# Patient Record
Sex: Female | Born: 1935 | Race: White | Hispanic: No | Marital: Married | Smoking: Never smoker
Health system: Southern US, Community
[De-identification: ages and names within clinical notes are randomized; demographics above are authoritative.]

## PROBLEM LIST (undated history)

## (undated) DIAGNOSIS — E079 Disorder of thyroid, unspecified: Secondary | ICD-10-CM

## (undated) DIAGNOSIS — I10 Essential (primary) hypertension: Secondary | ICD-10-CM

## (undated) HISTORY — PX: APPENDECTOMY: SHX54

## (undated) HISTORY — DX: Disorder of thyroid, unspecified: E07.9

## (undated) HISTORY — PX: ABDOMINAL HYSTERECTOMY: SHX81

---

## 1989-01-10 HISTORY — PX: BREAST EXCISIONAL BIOPSY: SUR124

## 2018-01-17 HISTORY — PX: BREAST BIOPSY: SHX20

## 2018-12-13 ENCOUNTER — Ambulatory Visit
Payer: Federal, State, Local not specified - PPO | Admitting: Student in an Organized Health Care Education/Training Program

## 2019-04-10 ENCOUNTER — Other Ambulatory Visit: Payer: Self-pay | Admitting: Physician Assistant

## 2019-04-10 DIAGNOSIS — Z1231 Encounter for screening mammogram for malignant neoplasm of breast: Secondary | ICD-10-CM

## 2019-04-19 ENCOUNTER — Other Ambulatory Visit: Payer: Self-pay | Admitting: Physician Assistant

## 2019-04-19 DIAGNOSIS — Z1231 Encounter for screening mammogram for malignant neoplasm of breast: Secondary | ICD-10-CM

## 2019-04-19 DIAGNOSIS — N644 Mastodynia: Secondary | ICD-10-CM

## 2019-05-01 ENCOUNTER — Ambulatory Visit
Admission: RE | Admit: 2019-05-01 | Discharge: 2019-05-01 | Disposition: A | Discharge status: Home / Self Care | Payer: Medicare Other | Source: Ambulatory Visit | Attending: Physician Assistant | Admitting: Physician Assistant

## 2019-05-01 DIAGNOSIS — Z1231 Encounter for screening mammogram for malignant neoplasm of breast: Secondary | ICD-10-CM

## 2019-05-01 DIAGNOSIS — N644 Mastodynia: Secondary | ICD-10-CM

## 2019-08-05 ENCOUNTER — Emergency Department
Admission: EM | Admit: 2019-08-05 | Discharge: 2019-08-05 | Disposition: A | Discharge status: Home / Self Care | Payer: Medicare Other | Attending: Emergency Medicine | Admitting: Emergency Medicine

## 2019-08-05 ENCOUNTER — Ambulatory Visit
Admission: EM | Admit: 2019-08-05 | Discharge: 2019-08-05 | Disposition: A | Discharge status: Home / Self Care | Payer: Medicare Other

## 2019-08-05 ENCOUNTER — Emergency Department: Payer: Medicare Other

## 2019-08-05 ENCOUNTER — Encounter: Payer: Self-pay | Admitting: Emergency Medicine

## 2019-08-05 ENCOUNTER — Other Ambulatory Visit: Payer: Self-pay

## 2019-08-05 DIAGNOSIS — Z79899 Other long term (current) drug therapy: Secondary | ICD-10-CM | POA: Diagnosis not present

## 2019-08-05 DIAGNOSIS — R519 Headache, unspecified: Secondary | ICD-10-CM | POA: Insufficient documentation

## 2019-08-05 DIAGNOSIS — I10 Essential (primary) hypertension: Secondary | ICD-10-CM | POA: Insufficient documentation

## 2019-08-05 DIAGNOSIS — E039 Hypothyroidism, unspecified: Secondary | ICD-10-CM | POA: Insufficient documentation

## 2019-08-05 HISTORY — DX: Essential (primary) hypertension: I10

## 2019-08-05 LAB — URINALYSIS, COMPLETE (UACMP) WITH MICROSCOPIC
Bilirubin Urine: NEGATIVE
Glucose, UA: NEGATIVE mg/dL
Hgb urine dipstick: NEGATIVE
Ketones, ur: NEGATIVE mg/dL
Leukocytes,Ua: NEGATIVE
Nitrite: NEGATIVE
Protein, ur: NEGATIVE mg/dL
Specific Gravity, Urine: 1.004 — ABNORMAL LOW (ref 1.005–1.030)
Squamous Epithelial / HPF: NONE SEEN (ref 0–5)
pH: 7 (ref 5.0–8.0)

## 2019-08-05 LAB — CBC WITH DIFFERENTIAL/PLATELET
Abs Immature Granulocytes: 0.02 10*3/uL (ref 0.00–0.07)
Basophils Absolute: 0 10*3/uL (ref 0.0–0.1)
Basophils Relative: 1 %
Eosinophils Absolute: 0.1 10*3/uL (ref 0.0–0.5)
Eosinophils Relative: 1 %
HCT: 38.6 % (ref 36.0–46.0)
Hemoglobin: 12.6 g/dL (ref 12.0–15.0)
Immature Granulocytes: 0 %
Lymphocytes Relative: 15 %
Lymphs Abs: 1 10*3/uL (ref 0.7–4.0)
MCH: 31.2 pg (ref 26.0–34.0)
MCHC: 32.6 g/dL (ref 30.0–36.0)
MCV: 95.5 fL (ref 80.0–100.0)
Monocytes Absolute: 0.4 10*3/uL (ref 0.1–1.0)
Monocytes Relative: 7 %
Neutro Abs: 4.9 10*3/uL (ref 1.7–7.7)
Neutrophils Relative %: 76 %
Platelets: 171 10*3/uL (ref 150–400)
RBC: 4.04 MIL/uL (ref 3.87–5.11)
RDW: 13.7 % (ref 11.5–15.5)
WBC: 6.4 10*3/uL (ref 4.0–10.5)
nRBC: 0 % (ref 0.0–0.2)

## 2019-08-05 LAB — BASIC METABOLIC PANEL
Anion gap: 11 (ref 5–15)
BUN: 14 mg/dL (ref 8–23)
CO2: 28 mmol/L (ref 22–32)
Calcium: 9.7 mg/dL (ref 8.9–10.3)
Chloride: 102 mmol/L (ref 98–111)
Creatinine, Ser: 0.96 mg/dL (ref 0.44–1.00)
GFR calc Af Amer: >60 mL/min (ref >60)
GFR calc non Af Amer: 55 mL/min — ABNORMAL LOW (ref >60)
Glucose, Bld: 103 mg/dL — ABNORMAL HIGH (ref 70–99)
Potassium: 4.3 mmol/L (ref 3.5–5.1)
Sodium: 141 mmol/L (ref 135–145)

## 2019-08-05 NOTE — ED Triage Notes (Signed)
Presents with a 3 day hx of HTN and h/a  Also has had some dizziness  Was sent in from [place]

## 2019-08-05 NOTE — ED Triage Notes (Addendum)
Patient reports that her BP has been elevated over the weekend. States she just moved to the area and does not have a PCP here yet.  Denies headaches, lightheadedness, dizziness, chest pain, ShOB

## 2019-08-05 NOTE — ED Provider Notes (Signed)
[facility] Emergency Department Provider Note ____________________________________________   First MD Initiated Contact with Patient 08/05/19 1438     (approximate)  I have reviewed the triage vital signs and the nursing notes.  HISTORY  Chief Complaint Hypertension   HPI Ashlee Prince is a 85 y.o. female presents to the ED for evaluation of headache and hypertension.   History of HTN on 2 agents: Losartan and Bystolic.  Hypothyroidism on Synthroid.   Patient reports compliance with her blood pressure medications and no changes to her regimen.  She reports having high blood pressure "all weekend," referring to elevated BP readings at home on her home cuff over the past 3 days.  She reports checking her blood pressure daily at baseline, noting systolics 100-150 last week, but this weekend have frequently been "getting close to 200."  She reports some associated dizziness with standing that self resolves in a matter of seconds, but denies additional complaints.  She denies syncope, falls, trauma, chest pain, shortness of breath, abdominal pain or vision changes.  She reports since being here in the ED developing a mild headache, that she relates to wait times in a long day.  She reports going to urgent care prior to this, and urgent care immediately sending her here for evaluation.  Denies headache prior to this afternoon.  Past Medical History:  Diagnosis Date  . Hypertension     There are no problems to display for this patient.   Past Surgical History:  Procedure Laterality Date  . ABDOMINAL HYSTERECTOMY    . APPENDECTOMY    . BREAST BIOPSY Right 01/17/2018   benign x 2  . BREAST EXCISIONAL BIOPSY Left 1991   2 areas benign    Prior to Admission medications   Medication Sig Start Date End Date Taking? Authorizing Provider  levothyroxine (SYNTHROID) 88 MCG tablet Take 88 mcg by mouth daily before breakfast.   Yes [provider]   lisinopril-hydrochlorothiazide (ZESTORETIC) 10-12.5 MG tablet Take 1 tablet by mouth daily.   Yes [provider]  losartan (COZAAR) 50 MG tablet Take 50 mg by mouth daily.   Yes [provider]  nebivolol (BYSTOLIC) 10 MG tablet Take 10 mg by mouth daily.   Yes [provider]  pravastatin (PRAVACHOL) 10 MG tablet Take 10 mg by mouth daily.   Yes [provider]    Allergies Other, Propoxyphene, and Sulfa antibiotics  No family history on file.  Social History Social History   Tobacco Use  . Smoking status: Never Smoker  . Smokeless tobacco: Never Used  Vaping Use  . Vaping Use: Never used  Substance Use Topics  . Alcohol use: Not Currently  . Drug use: Not on file    Review of Systems  Constitutional: No fever/chills Eyes: No visual changes. ENT: No sore throat. Cardiovascular: Denies chest pain.  Positive for elevated blood pressure. Respiratory: Denies shortness of breath. Gastrointestinal: No abdominal pain.  No nausea, no vomiting.  No diarrhea.  No constipation. Genitourinary: Negative for dysuria. Musculoskeletal: Negative for back pain. Skin: Negative for rash. Neurological: Negative for focal weakness or numbness.  Positive for headache.   ____________________________________________   PHYSICAL EXAM:  VITAL SIGNS: ED Triage Vitals  Enc Vitals Group     BP 08/05/19 1430 (!) 205/124     Pulse Rate 08/05/19 1431 70     Resp 08/05/19 1430 18     Temp 08/05/19 1430 98 F (36.7 C)     Temp Source  08/05/19 1430 Oral     SpO2 08/05/19 1430 98 %     Weight 08/05/19 1431 125 lb (56.7 kg)     Height 08/05/19 1431 5\' 2"  (1.575 m)     Head Circumference --      Peak Flow --      Pain Score 08/05/19 1431 8     Pain Loc --      Pain Edu? --      Excl. in GC? --      Constitutional: Alert and oriented. Well appearing and in no acute distress.  Conversational in full sentences.  Able to get up from the stretcher  independently and ambulate with a normal gait without distress.  She reports a similar lightheaded dizziness with standing, self resolved so she is ambulating. Eyes: Conjunctivae are normal. PERRL. EOMI. Head: Atraumatic. Nose: No congestion/rhinnorhea. Mouth/Throat: Mucous membranes are moist.  Oropharynx non-erythematous. Neck: No stridor. No cervical spine tenderness to palpation. Cardiovascular: Normal rate, regular rhythm. Grossly normal heart sounds.  Good peripheral circulation. Respiratory: Normal respiratory effort.  No retractions. Lungs CTAB. Gastrointestinal: Soft , nondistended, nontender to palpation. No abdominal bruits. No CVA tenderness. Musculoskeletal: No lower extremity tenderness nor edema.  No joint effusions. No signs of acute trauma. Neurologic:  Normal speech and language. No gross focal neurologic deficits are appreciated. No gait instability noted. Cranial nerves II through XII intact. Full strength and sensation in all 4 extremities Skin:  Skin is warm, dry and intact. No rash noted. Psychiatric: Mood and affect are normal. Speech and behavior are normal.  ____________________________________________   LABS (all labs ordered are listed, but only abnormal results are displayed)  Labs Reviewed  BASIC METABOLIC PANEL - Abnormal; Notable for the following components:      Result Value   Glucose, Bld 103 (*)    GFR calc non Af Amer 55 (*)    All other components within normal limits  URINALYSIS, COMPLETE (UACMP) WITH MICROSCOPIC - Abnormal; Notable for the following components:   Color, Urine COLORLESS (*)    APPearance CLEAR (*)    Specific Gravity, Urine 1.004 (*)    Bacteria, UA RARE (*)    All other components within normal limits  CBC WITH DIFFERENTIAL/PLATELET   ____________________________________________  12 Lead EKG Sinus rhythm, rate of 75 bpm and left axis deviation.  Normal intervals.  No evidence of acute  ischemia.  ____________________________________________  RADIOLOGY  ED MD interpretation: CT head obtained possibility of SAH in the setting of hypertension and mild headache, results reviewed remarkable for no evidence of acute intracranial pathology  Official radiology report(s): CT Head Wo Contrast  Result Date: 08/05/2019 CLINICAL DATA:  Headache, intracranial hemorrhage suspected Dizziness, non-specific acute headache and hypertension, eval SAH EXAM: CT HEAD WITHOUT CONTRAST TECHNIQUE: Contiguous axial images were obtained from the base of the skull through the vertex without intravenous contrast. COMPARISON:  None. FINDINGS: Brain: Age related atrophy. No intracranial hemorrhage, mass effect, or midline shift. No hydrocephalus. The basilar cisterns are patent. Mild periventricular white matter changes typical of chronic small vessel ischemia. No evidence of territorial infarct or acute ischemia. No extra-axial or intracranial fluid collection. Vascular: Atherosclerosis of skullbase vasculature without hyperdense vessel or abnormal calcification. Skull: No fracture or focal lesion. Sinuses/Orbits: Paranasal sinuses and mastoid air cells are clear. The visualized orbits are unremarkable. Bilateral cataract resection Other: None. IMPRESSION: 1. No acute intracranial abnormality. Specifically, no subarachnoid hemorrhage. 2. Age related atrophy and chronic small vessel ischemia. Electronically Signed  By: Narda Rutherford M.D.   On: 08/05/2019 15:49    ____________________________________________   PROCEDURES  Procedure(s) performed (including Critical Care):  Procedures   ____________________________________________   INITIAL IMPRESSION / ASSESSMENT AND PLAN / ED COURSE  Pleasant 85 year old woman presenting with asymptomatic hypertension, without evidence of endorgan damage or additional acute pathology, and amenable to discharge home with PCP follow-up.  Patient persistently  hypertensive with systolics ranging 180-200s.  She reports she has felt well over the past 3 days as she has noticed these high systolics at home, and reports generating a mild headache since waiting in the waiting room.  CT head without evidence of SAH or any acute pathology.  Blood work without acute derangements and nonischemic EKG.  She has no chest pain, shortness of breath, syncope or further concerning symptoms.  Home dose of losartan provided here in the ED with improvement of her blood pressure.  She continues to have no complaints.  Advised patient of maintaining home blood pressure log/diary to bring to her PCP within the next 5 days to discuss her blood pressure regimen.  Return precautions for the ED were discussed.  Clinical Course as of Aug 05 2046  Mon Aug 05, 2019  1613 Patient reports taking her home 50 mg losartan here in the ED at about 3:30 PM.   [DS]  1704 Reassessed.  Patient reports feeling well having no complaints.  She requests discharge home.  Educated patient on home BP management and surveillance of her blood pressure once daily in normal circumstance, and writing this value down to bring to her PCP.  We discussed her reassuring work-up here in the ED without evidence of endorgan damage or acute pathology.  Return precautions for the ED were discussed.   [DS]    Clinical Course User Index [DS] Delton Prairie, MD     ____________________________________________   FINAL CLINICAL IMPRESSION(S) / ED DIAGNOSES  Final diagnoses:  Essential hypertension  Acute nonintractable headache, unspecified headache type     ED Discharge Orders    None       Kerman Pfost   Note:  This document was prepared using Dragon voice recognition software and may include unintentional dictation errors.   Delton Prairie, MD 08/05/19 [phone]

## 2019-08-05 NOTE — ED Notes (Signed)
Pt took a 50mg  Losartan from home use at this time.

## 2019-08-05 NOTE — ED Notes (Signed)
Patient is being discharged from the Urgent Care and sent to the Emergency Department via POV . Per Wendee Beavers, NP, patient is in need of higher level of care due to HTN requiring further workup than appropriate for Urgent Care. Patient is aware and verbalizes understanding of plan of care.  Vitals:   08/05/19 1204 08/05/19 1210  BP: (!) 191/110 (!) 190/112  Pulse: 72   Resp: 14   Temp: 98.1 F (36.7 C)   SpO2: 95%

## 2019-08-05 NOTE — Discharge Instructions (Signed)
You were seen in the emergency department because of your elevated blood pressure and headache.  We did blood work, EKG heart testing and a CT scan of your head that did not show any signs of damage from your high blood pressure.  Please continue your blood pressure medication regimen at home.  I recommend checking your blood pressure about once daily at home and a normal environment when you are not stressed, and write this number down to bring to your PCP.  If you develop any worsening acute headaches, chest pain, shortness of breath or passing out, please return to the ED.

## 2019-08-05 NOTE — ED Notes (Signed)
Pt ambulatory to the restroom without assistance.  

## 2019-08-05 NOTE — Discharge Instructions (Signed)
Please proceed straight to the emergency department at [facility].

## 2020-04-17 ENCOUNTER — Other Ambulatory Visit: Payer: Self-pay | Admitting: Physician Assistant

## 2020-04-17 DIAGNOSIS — Z1231 Encounter for screening mammogram for malignant neoplasm of breast: Secondary | ICD-10-CM

## 2020-04-22 ENCOUNTER — Other Ambulatory Visit: Payer: Self-pay | Admitting: Physician Assistant

## 2020-04-22 DIAGNOSIS — I340 Nonrheumatic mitral (valve) insufficiency: Secondary | ICD-10-CM

## 2020-04-23 ENCOUNTER — Other Ambulatory Visit: Payer: Self-pay | Admitting: Physician Assistant

## 2020-04-23 DIAGNOSIS — I340 Nonrheumatic mitral (valve) insufficiency: Secondary | ICD-10-CM

## 2020-04-30 ENCOUNTER — Ambulatory Visit (INDEPENDENT_AMBULATORY_CARE_PROVIDER_SITE_OTHER): Payer: Medicare Other

## 2020-04-30 ENCOUNTER — Other Ambulatory Visit: Payer: Self-pay

## 2020-04-30 DIAGNOSIS — I340 Nonrheumatic mitral (valve) insufficiency: Secondary | ICD-10-CM

## 2020-04-30 LAB — ECHOCARDIOGRAM COMPLETE
AR max vel: 1.99 cm2
AV Area VTI: 1.93 cm2
AV Area mean vel: 2.08 cm2
AV Mean grad: 3 mmHg
AV Peak grad: 5.5 mmHg
Ao pk vel: 1.17 m/s
Area-P 1/2: 2.82 cm2
Calc EF: 54.8 %
S' Lateral: 2.4 cm
Single Plane A2C EF: 51.4 %
Single Plane A4C EF: 56.8 %

## 2020-05-05 ENCOUNTER — Ambulatory Visit
Admission: RE | Admit: 2020-05-05 | Discharge: 2020-05-05 | Disposition: A | Discharge status: Home / Self Care | Payer: Medicare Other | Source: Ambulatory Visit | Attending: Physician Assistant | Admitting: Physician Assistant

## 2020-05-05 ENCOUNTER — Other Ambulatory Visit: Payer: Self-pay

## 2020-05-05 DIAGNOSIS — Z1231 Encounter for screening mammogram for malignant neoplasm of breast: Secondary | ICD-10-CM | POA: Diagnosis not present

## 2020-07-23 ENCOUNTER — Other Ambulatory Visit (HOSPITAL_COMMUNITY): Payer: Self-pay | Admitting: Orthopedic Surgery

## 2020-07-23 ENCOUNTER — Other Ambulatory Visit: Payer: Self-pay | Admitting: Orthopedic Surgery

## 2020-07-23 DIAGNOSIS — M9933 Osseous stenosis of neural canal of lumbar region: Secondary | ICD-10-CM

## 2020-07-30 ENCOUNTER — Ambulatory Visit
Admission: RE | Admit: 2020-07-30 | Discharge: 2020-07-30 | Disposition: A | Discharge status: Home / Self Care | Payer: Medicare Other | Source: Ambulatory Visit | Attending: Orthopedic Surgery | Admitting: Orthopedic Surgery

## 2020-07-30 ENCOUNTER — Other Ambulatory Visit: Payer: Self-pay

## 2020-07-30 DIAGNOSIS — M9933 Osseous stenosis of neural canal of lumbar region: Secondary | ICD-10-CM | POA: Diagnosis present

## 2020-10-12 ENCOUNTER — Other Ambulatory Visit (HOSPITAL_COMMUNITY): Payer: Self-pay | Admitting: Gastroenterology

## 2020-10-12 ENCOUNTER — Other Ambulatory Visit: Payer: Self-pay | Admitting: Gastroenterology

## 2020-10-12 DIAGNOSIS — R1011 Right upper quadrant pain: Secondary | ICD-10-CM

## 2020-10-12 DIAGNOSIS — R9389 Abnormal findings on diagnostic imaging of other specified body structures: Secondary | ICD-10-CM

## 2020-10-22 ENCOUNTER — Ambulatory Visit
Admission: RE | Admit: 2020-10-22 | Discharge: 2020-10-22 | Disposition: A | Discharge status: Home / Self Care | Payer: Medicare Other | Source: Ambulatory Visit | Attending: Gastroenterology | Admitting: Gastroenterology

## 2020-10-22 ENCOUNTER — Other Ambulatory Visit: Payer: Self-pay

## 2020-10-22 DIAGNOSIS — R9389 Abnormal findings on diagnostic imaging of other specified body structures: Secondary | ICD-10-CM | POA: Diagnosis not present

## 2020-10-22 DIAGNOSIS — R1011 Right upper quadrant pain: Secondary | ICD-10-CM | POA: Diagnosis present

## 2020-10-22 MED ORDER — GADOBUTROL 1 MMOL/ML IV SOLN
5.0000 mL | Freq: Once | INTRAVENOUS | Status: AC | PRN
  Administered 2020-10-22: 5 mL via INTRAVENOUS

## 2021-04-01 ENCOUNTER — Other Ambulatory Visit: Payer: Self-pay | Admitting: Physician Assistant

## 2021-04-02 ENCOUNTER — Other Ambulatory Visit: Payer: Self-pay | Admitting: Infectious Diseases

## 2021-04-02 DIAGNOSIS — Z1231 Encounter for screening mammogram for malignant neoplasm of breast: Secondary | ICD-10-CM

## 2021-05-10 ENCOUNTER — Ambulatory Visit
Admission: RE | Admit: 2021-05-10 | Discharge: 2021-05-10 | Disposition: A | Discharge status: Home / Self Care | Payer: Medicare Other | Source: Ambulatory Visit | Attending: Infectious Diseases | Admitting: Infectious Diseases

## 2021-05-10 DIAGNOSIS — Z1231 Encounter for screening mammogram for malignant neoplasm of breast: Secondary | ICD-10-CM | POA: Insufficient documentation

## 2021-05-11 ENCOUNTER — Other Ambulatory Visit: Payer: Self-pay | Admitting: Infectious Diseases

## 2021-05-11 DIAGNOSIS — R270 Ataxia, unspecified: Secondary | ICD-10-CM

## 2021-05-11 DIAGNOSIS — I10 Essential (primary) hypertension: Secondary | ICD-10-CM

## 2021-05-14 ENCOUNTER — Ambulatory Visit
Admission: RE | Admit: 2021-05-14 | Discharge: 2021-05-14 | Disposition: A | Discharge status: Home / Self Care | Payer: Medicare Other | Source: Ambulatory Visit | Attending: Infectious Diseases | Admitting: Infectious Diseases

## 2021-05-14 DIAGNOSIS — I10 Essential (primary) hypertension: Secondary | ICD-10-CM

## 2021-05-14 DIAGNOSIS — R270 Ataxia, unspecified: Secondary | ICD-10-CM

## 2022-04-06 ENCOUNTER — Other Ambulatory Visit: Payer: Self-pay | Admitting: Infectious Diseases

## 2022-04-06 DIAGNOSIS — Z1231 Encounter for screening mammogram for malignant neoplasm of breast: Secondary | ICD-10-CM

## 2022-04-21 ENCOUNTER — Other Ambulatory Visit: Payer: Self-pay | Admitting: Infectious Diseases

## 2022-04-21 DIAGNOSIS — G44319 Acute post-traumatic headache, not intractable: Secondary | ICD-10-CM

## 2022-04-21 DIAGNOSIS — W1800XA Striking against unspecified object with subsequent fall, initial encounter: Secondary | ICD-10-CM

## 2022-04-25 ENCOUNTER — Ambulatory Visit
Admission: RE | Admit: 2022-04-25 | Discharge: 2022-04-25 | Disposition: A | Discharge status: Home / Self Care | Payer: Medicare Other | Source: Ambulatory Visit | Attending: Infectious Diseases | Admitting: Infectious Diseases

## 2022-04-25 DIAGNOSIS — G44319 Acute post-traumatic headache, not intractable: Secondary | ICD-10-CM | POA: Diagnosis present

## 2022-04-25 DIAGNOSIS — W1800XA Striking against unspecified object with subsequent fall, initial encounter: Secondary | ICD-10-CM | POA: Insufficient documentation

## 2022-04-25 DIAGNOSIS — X58XXXA Exposure to other specified factors, initial encounter: Secondary | ICD-10-CM | POA: Insufficient documentation

## 2022-06-21 ENCOUNTER — Ambulatory Visit
Admission: RE | Admit: 2022-06-21 | Discharge: 2022-06-21 | Disposition: A | Discharge status: Home / Self Care | Payer: Medicare Other | Source: Ambulatory Visit | Attending: Infectious Diseases | Admitting: Infectious Diseases

## 2022-06-21 DIAGNOSIS — Z1231 Encounter for screening mammogram for malignant neoplasm of breast: Secondary | ICD-10-CM | POA: Diagnosis not present

## 2022-06-24 ENCOUNTER — Other Ambulatory Visit: Payer: Self-pay | Admitting: Infectious Diseases

## 2022-06-24 DIAGNOSIS — R928 Other abnormal and inconclusive findings on diagnostic imaging of breast: Secondary | ICD-10-CM

## 2022-06-24 DIAGNOSIS — N6489 Other specified disorders of breast: Secondary | ICD-10-CM

## 2022-06-28 ENCOUNTER — Ambulatory Visit
Admission: RE | Admit: 2022-06-28 | Discharge: 2022-06-28 | Disposition: A | Discharge status: Home / Self Care | Payer: Medicare Other | Source: Ambulatory Visit | Attending: Infectious Diseases | Admitting: Infectious Diseases

## 2022-06-28 DIAGNOSIS — R928 Other abnormal and inconclusive findings on diagnostic imaging of breast: Secondary | ICD-10-CM | POA: Diagnosis present

## 2022-06-28 DIAGNOSIS — N6489 Other specified disorders of breast: Secondary | ICD-10-CM

## 2022-07-04 ENCOUNTER — Other Ambulatory Visit (INDEPENDENT_AMBULATORY_CARE_PROVIDER_SITE_OTHER): Payer: Self-pay | Admitting: Nurse Practitioner

## 2022-07-04 DIAGNOSIS — I83893 Varicose veins of bilateral lower extremities with other complications: Secondary | ICD-10-CM

## 2022-07-07 ENCOUNTER — Ambulatory Visit (INDEPENDENT_AMBULATORY_CARE_PROVIDER_SITE_OTHER): Payer: Medicare Other

## 2022-07-07 ENCOUNTER — Encounter (INDEPENDENT_AMBULATORY_CARE_PROVIDER_SITE_OTHER): Payer: Self-pay | Admitting: Nurse Practitioner

## 2022-07-07 ENCOUNTER — Ambulatory Visit (INDEPENDENT_AMBULATORY_CARE_PROVIDER_SITE_OTHER): Payer: Medicare Other | Admitting: Nurse Practitioner

## 2022-07-07 VITALS — BP 190/121 | HR 79 | Resp 18 | Ht 62.0 in | Wt 121.0 lb

## 2022-07-07 DIAGNOSIS — I8312 Varicose veins of left lower extremity with inflammation: Secondary | ICD-10-CM

## 2022-07-07 DIAGNOSIS — I10 Essential (primary) hypertension: Secondary | ICD-10-CM | POA: Diagnosis not present

## 2022-07-07 DIAGNOSIS — I8311 Varicose veins of right lower extremity with inflammation: Secondary | ICD-10-CM | POA: Diagnosis not present

## 2022-07-07 DIAGNOSIS — I83893 Varicose veins of bilateral lower extremities with other complications: Secondary | ICD-10-CM

## 2022-07-08 ENCOUNTER — Encounter (INDEPENDENT_AMBULATORY_CARE_PROVIDER_SITE_OTHER): Payer: Self-pay | Admitting: Nurse Practitioner

## 2022-07-08 NOTE — Progress Notes (Signed)
Subjective:    Patient ID: Ashlee Prince, female    DOB: [birth year], 88 y.o.   MRN: [id number] Chief Complaint  Patient presents with   Establish Care    Ashlee Prince is an 88 year old female that presents today as a referral from her primary care provider Dr. Sampson Goon for evaluation of possible varicose veins causing pain in the lower extremities.  The patient notes some tenderness and aching around the calf area.  She denies this being claudication-like pain.  She notes that they are tender in these areas she has some notable varicosities.  She has never had any bleeding from these varicosities.  Today noninvasive studies show no evidence of DVT or superficial, phlebitis bilaterally.  No evidence of deep venous insufficiency bilaterally.  No evidence of superficial venous reflux noted bilaterally.    Review of Systems  Cardiovascular:  Positive for leg swelling.  All other systems reviewed and are negative.      Objective:   Physical Exam Vitals reviewed.  HENT:     Head: Normocephalic.  Cardiovascular:     Rate and Rhythm: Normal rate.     Pulses: Normal pulses.  Pulmonary:     Effort: Pulmonary effort is normal.  Skin:    General: Skin is warm and dry.  Neurological:     Mental Status: She is alert and oriented to person, place, and time.  Psychiatric:        Mood and Affect: Mood normal.        Behavior: Behavior normal.        Thought Content: Thought content normal.        Judgment: Judgment normal.     BP (!) 190/121 (BP Location: Left Arm)   Pulse 79   Resp 18   Ht 5\' 2"  (1.575 m)   Wt 121 lb (54.9 kg)   BMI 22.13 kg/m   Past Medical History:  Diagnosis Date   Hypertension    Thyroid disease     Social History   Socioeconomic History   Marital status: Married    Spouse name: Not on file   Number of children: Not on file   Years of education: Not on file   Highest education level: Not on file  Occupational History   Not on file   Tobacco Use   Smoking status: Never   Smokeless tobacco: Never  Vaping Use   Vaping Use: Never used  Substance and Sexual Activity   Alcohol use: Not Currently   Drug use: Not on file   Sexual activity: Not on file  Other Topics Concern   Not on file  Social History Narrative   Not on file   Social Determinants of Health   Financial Resource Strain: Not on file  Food Insecurity: Not on file  Transportation Needs: Not on file  Physical Activity: Not on file  Stress: Not on file  Social Connections: Not on file  Intimate Partner Violence: Not on file    Past Surgical History:  Procedure Laterality Date   ABDOMINAL HYSTERECTOMY     APPENDECTOMY     BREAST BIOPSY Right 01/17/2018   benign x 2   BREAST EXCISIONAL BIOPSY Left 1991   2 areas benign    Family History  Problem Relation Age of Onset   Varicose Veins Mother    Hypertension Mother    Heart disease Father    Breast cancer Neg Hx     Allergies  Allergen Reactions   Other  Per patient she had a reaction to swine flu injection.   Propoxyphene Other (See Comments)    Other reaction(s): Other (See Comments) Anxiety and tachycardia Anxiety and tachycardia    Sulfa Antibiotics Nausea Only       Latest Ref Rng & Units 08/05/2019    2:52 PM  CBC  WBC 4.0 - 10.5 K/uL 6.4   Hemoglobin 12.0 - 15.0 g/dL 16.1   Hematocrit 09.6 - 46.0 % 38.6   Platelets 150 - 400 K/uL 171       CMP     Component Value Date/Time   NA 141 08/05/2019 1452   K 4.3 08/05/2019 1452   CL 102 08/05/2019 1452   CO2 28 08/05/2019 1452   GLUCOSE 103 (H) 08/05/2019 1452   BUN 14 08/05/2019 1452   CREATININE 0.96 08/05/2019 1452   CALCIUM 9.7 08/05/2019 1452   GFRNONAA 55 (L) 08/05/2019 1452     No results found.     Assessment & Plan:   1. Varicose veins of both lower extremities with inflammation  Recommend:  The patient has large symptomatic varicose veins that are painful and associated with swelling. The  patient is CEAP [id number]   I have had a long discussion with the patient regarding  varicose veins and why they cause symptoms.  Patient will begin wearing graduated compression stockings class 1 on a daily basis, beginning first thing in the morning and removing them in the evening. The patient is instructed specifically not to sleep in the stockings.    The patient  will also begin using over-the-counter analgesics such as Motrin 600 mg po TID to help control the symptoms.    In addition, behavioral modification including elevation during the day will be initiated.    Pending the results of these changes the  patient will be reevaluated in three months.   An ultrasound of the venous system will be obtained.   Further plans will be based on the ultrasound results and whether conservative therapies are successful at eliminating the pain and swelling.   2. Essential hypertension The patient's blood pressure is elevated today but she does have a syndrome.  Her blood pressure has been stable previously at home.  She is advised to continue with evaluation at home if remains elevated she may need emergent attention to call her primary care provider for medication adjustment.   Current Outpatient Medications on File Prior to Visit  Medication Sig Dispense Refill   amoxicillin (AMOXIL) 500 MG capsule TAKE FOUR CAPSULES BY MOUTH ONE HOUR BEFORE APPOINTMENT     Calcium Ascorbate 500 MG TABS Take 1 tablet by mouth daily.     calcium carbonate (OSCAL) 1500 (600 Ca) MG TABS tablet Calcium 600     carvedilol (COREG) 6.25 MG tablet SMARTSIG:1 Tablet(s) By Mouth Morning-Evening     Cholecalciferol (D 1000) 25 MCG (1000 UT) capsule Vitamin D3  daily     cloNIDine (CATAPRES) 0.1 MG tablet Take 0.1 mg by mouth daily as needed.     cyanocobalamin (VITAMIN B12) 250 MCG tablet Take 1 tablet by mouth daily.     cycloSPORINE (RESTASIS) 0.05 % ophthalmic emulsion Apply to eye.     Docusate Sodium (DSS) 100 MG  CAPS Take by mouth.     magnesium oxide (MAG-OX) 400 MG tablet Take by mouth.     Multiple Vitamins-Minerals (PRESERVISION AREDS) CAPS Take by mouth.     SYNTHROID 50 MCG tablet Take 50 mcg by mouth every morning.  valsartan (DIOVAN) 160 MG tablet Take 160 mg by mouth 2 (two) times daily.     XIIDRA 5 % SOLN Apply to eye.     zolpidem (AMBIEN) 5 MG tablet Take 1 tablet by mouth at bedtime as needed.     No current facility-administered medications on file prior to visit.    There are no Patient Instructions on file for this visit. No follow-ups on file.   Georgiana Spinner, NP

## 2022-09-22 ENCOUNTER — Other Ambulatory Visit: Payer: Self-pay | Admitting: Infectious Diseases

## 2022-09-22 DIAGNOSIS — K7689 Other specified diseases of liver: Secondary | ICD-10-CM

## 2022-09-28 ENCOUNTER — Ambulatory Visit
Admission: RE | Admit: 2022-09-28 | Discharge: 2022-09-28 | Disposition: A | Discharge status: Home / Self Care | Payer: Medicare Other | Source: Ambulatory Visit | Attending: Infectious Diseases | Admitting: Infectious Diseases

## 2022-09-28 ENCOUNTER — Other Ambulatory Visit: Payer: Self-pay | Admitting: Infectious Diseases

## 2022-09-28 DIAGNOSIS — K7689 Other specified diseases of liver: Secondary | ICD-10-CM | POA: Insufficient documentation

## 2022-09-28 MED ORDER — GADOBUTROL 1 MMOL/ML IV SOLN
5.0000 mL | Freq: Once | INTRAVENOUS | Status: AC | PRN
  Administered 2022-09-28: 5 mL via INTRAVENOUS

## 2022-10-06 ENCOUNTER — Ambulatory Visit (INDEPENDENT_AMBULATORY_CARE_PROVIDER_SITE_OTHER): Payer: Medicare Other | Admitting: Nurse Practitioner

## 2022-11-08 ENCOUNTER — Ambulatory Visit (INDEPENDENT_AMBULATORY_CARE_PROVIDER_SITE_OTHER): Payer: Medicare Other | Admitting: Nurse Practitioner

## 2022-11-08 ENCOUNTER — Encounter (INDEPENDENT_AMBULATORY_CARE_PROVIDER_SITE_OTHER): Payer: Self-pay | Admitting: Nurse Practitioner

## 2022-11-08 VITALS — BP 145/91 | HR 73 | Resp 18 | Ht 63.0 in | Wt 118.2 lb

## 2022-11-08 DIAGNOSIS — I8311 Varicose veins of right lower extremity with inflammation: Secondary | ICD-10-CM

## 2022-11-08 DIAGNOSIS — I10 Essential (primary) hypertension: Secondary | ICD-10-CM | POA: Diagnosis not present

## 2022-11-08 DIAGNOSIS — I8312 Varicose veins of left lower extremity with inflammation: Secondary | ICD-10-CM | POA: Diagnosis not present

## 2022-11-11 ENCOUNTER — Other Ambulatory Visit: Payer: Self-pay | Admitting: Gastroenterology

## 2022-11-11 DIAGNOSIS — K581 Irritable bowel syndrome with constipation: Secondary | ICD-10-CM

## 2022-11-11 DIAGNOSIS — G8929 Other chronic pain: Secondary | ICD-10-CM

## 2022-11-11 DIAGNOSIS — R1312 Dysphagia, oropharyngeal phase: Secondary | ICD-10-CM

## 2022-11-13 NOTE — Progress Notes (Unsigned)
Subjective:    Patient ID: Ashlee Prince, female    DOB: November 11, 1934, 88 y.o.   MRN: [id number] Chief Complaint  Patient presents with   Follow-up    3 month no studies    Ashlee Prince is an 88 year old female that presents today as a referral from her primary care provider Dr. Sampson Goon for evaluation of possible varicose veins causing pain in the lower extremities.  The patient notes some tenderness and aching around the calf area.  She denies this being claudication-like pain.  She notes that they are tender in these areas she has some notable varicosities.  She has never had any bleeding from these varicosities.   previous noninvasive studies show no evidence of DVT or superficial, phlebitis bilaterally.  No evidence of deep venous insufficiency bilaterally.  No evidence of superficial venous reflux noted bilaterally.  She notes that conservative therapy has been helpful with maintaining.  She also denies any further issues.      Review of Systems  All other systems reviewed and are negative.      Objective:   Physical Exam Vitals reviewed.  HENT:     Head: Normocephalic.  Cardiovascular:     Rate and Rhythm: Normal rate.  Pulmonary:     Effort: Pulmonary effort is normal.  Skin:    General: Skin is warm and dry.  Neurological:     Mental Status: She is alert and oriented to person, place, and time.  Psychiatric:        Mood and Affect: Mood normal.        Behavior: Behavior normal.        Thought Content: Thought content normal.        Judgment: Judgment normal.     BP (!) 145/91 (BP Location: Left Arm)   Pulse 73   Resp 18   Ht 5\' 3"  (1.6 m)   Wt 118 lb 3.2 oz (53.6 kg)   BMI 20.94 kg/m   Past Medical History:  Diagnosis Date   Hypertension    Thyroid disease     Social History   Socioeconomic History   Marital status: Married    Spouse name: Not on file   Number of children: Not on file   Years of education: Not on file   Highest  education level: Not on file  Occupational History   Not on file  Tobacco Use   Smoking status: Never   Smokeless tobacco: Never  Vaping Use   Vaping status: Never Used  Substance and Sexual Activity   Alcohol use: Not Currently   Drug use: Not on file   Sexual activity: Not on file  Other Topics Concern   Not on file  Social History Narrative   Not on file   Social Determinants of Health   Financial Resource Strain: Patient Declined (09/21/2022)   Received from [facility]   Overall Financial Resource Strain (CARDIA)    Difficulty of Paying Living Expenses: Patient declined  Food Insecurity: Patient Declined (09/21/2022)   Received from [facility]   Hunger Vital Sign    Worried About Running Out of Food in the Last Year: Patient declined    Ran Out of Food in the Last Year: Patient declined  Transportation Needs: Patient Declined (09/21/2022)   Received from [facility] - Transportation    In the past 12 months, has lack of transportation kept you from medical appointments or from getting medications?:  Patient declined    Lack of Transportation (Non-Medical): Patient declined  Physical Activity: Not on file  Stress: Not on file  Social Connections: Not on file  Intimate Partner Violence: Not on file    Past Surgical History:  Procedure Laterality Date   ABDOMINAL HYSTERECTOMY     APPENDECTOMY     BREAST BIOPSY Right 01/17/2018   benign x 2   BREAST EXCISIONAL BIOPSY Left 1991   2 areas benign    Family History  Problem Relation Age of Onset   Varicose Veins Mother    Hypertension Mother    Heart disease Father    Breast cancer Neg Hx     Allergies  Allergen Reactions   Other     Per patient she had a reaction to swine flu injection.   Propoxyphene Other (See Comments)    Other reaction(s): Other (See Comments) Anxiety and tachycardia Anxiety and tachycardia    Sulfa Antibiotics Nausea Only        Latest Ref Rng & Units 08/05/2019    2:52 PM  CBC  WBC 4.0 - 10.5 K/uL 6.4   Hemoglobin 12.0 - 15.0 g/dL 96.0   Hematocrit 45.4 - 46.0 % 38.6   Platelets 150 - 400 K/uL 171       CMP     Component Value Date/Time   NA 141 08/05/2019 1452   K 4.3 08/05/2019 1452   CL 102 08/05/2019 1452   CO2 28 08/05/2019 1452   GLUCOSE 103 (H) 08/05/2019 1452   BUN 14 08/05/2019 1452   CREATININE 0.96 08/05/2019 1452   CALCIUM 9.7 08/05/2019 1452   GFRNONAA 55 (L) 08/05/2019 1452     No results found.     Assessment & Plan:   1. Varicose veins of both lower extremities with inflammation Recommend:  No surgery or intervention at this point in time.  I have reviewed my discussion with the patient regarding venous insufficiency and why it causes symptoms. I have discussed with the patient the chronic skin changes that accompany venous insufficiency and the long term sequela such as ulceration. Patient will contnue wearing graduated compression stockings on a daily basis, as this has provided excellent control of his edema. The patient will put the stockings on first thing in the morning and removing them in the evening. The patient is reminded not to sleep in the stockings.  In addition, behavioral modification including elevation during the day will be initiated. Exercise is strongly encouraged.  Previous duplex ultrasound of the lower extremities shows normal deep system, no significant superficial reflux was identified.  Given the patient's good control and lack of any problems regarding the venous insufficiency and lymphedema a lymph pump in not need at this time.    The patient will follow up with me PRN should anything change.  The patient voices agreement with this plan.  2. Essential hypertension Continue antihypertensive medications as already ordered, these medications have been reviewed and there are no changes at this time.   Current Outpatient Medications on File  Prior to Visit  Medication Sig Dispense Refill   amoxicillin (AMOXIL) 500 MG capsule TAKE FOUR CAPSULES BY MOUTH ONE HOUR BEFORE APPOINTMENT     Calcium Ascorbate 500 MG TABS Take 1 tablet by mouth daily.     calcium carbonate (OSCAL) 1500 (600 Ca) MG TABS tablet Calcium 600     carvedilol (COREG) 6.25 MG tablet SMARTSIG:1 Tablet(s) By Mouth Morning-Evening     Cholecalciferol (D 1000) 25 MCG (  1000 UT) capsule Vitamin D3  daily     cloNIDine (CATAPRES) 0.1 MG tablet Take 0.1 mg by mouth daily as needed.     cyanocobalamin (VITAMIN B12) 250 MCG tablet Take 1 tablet by mouth daily.     cycloSPORINE (RESTASIS) 0.05 % ophthalmic emulsion Apply to eye.     Docusate Sodium (DSS) 100 MG CAPS Take by mouth.     magnesium oxide (MAG-OX) 400 MG tablet Take by mouth.     Multiple Vitamins-Minerals (PRESERVISION AREDS) CAPS Take by mouth.     SYNTHROID 50 MCG tablet Take 50 mcg by mouth every morning.     valsartan (DIOVAN) 160 MG tablet Take 160 mg by mouth 2 (two) times daily.     XIIDRA 5 % SOLN Apply to eye.     zolpidem (AMBIEN) 5 MG tablet Take 1 tablet by mouth at bedtime as needed.     No current facility-administered medications on file prior to visit.    There are no Patient Instructions on file for this visit. No follow-ups on file.   Georgiana Spinner, NP

## 2022-11-15 ENCOUNTER — Ambulatory Visit
Admission: RE | Admit: 2022-11-15 | Discharge: 2022-11-15 | Disposition: A | Discharge status: Home / Self Care | Payer: Medicare Other | Source: Ambulatory Visit | Attending: Gastroenterology | Admitting: Gastroenterology

## 2022-11-15 DIAGNOSIS — K581 Irritable bowel syndrome with constipation: Secondary | ICD-10-CM | POA: Diagnosis present

## 2022-11-15 DIAGNOSIS — G8929 Other chronic pain: Secondary | ICD-10-CM | POA: Insufficient documentation

## 2022-11-15 DIAGNOSIS — R1011 Right upper quadrant pain: Secondary | ICD-10-CM | POA: Diagnosis present

## 2022-11-15 DIAGNOSIS — R1312 Dysphagia, oropharyngeal phase: Secondary | ICD-10-CM | POA: Insufficient documentation

## 2023-03-03 ENCOUNTER — Encounter: Payer: Self-pay | Admitting: Intensive Care

## 2023-03-03 ENCOUNTER — Other Ambulatory Visit: Payer: Self-pay

## 2023-03-03 ENCOUNTER — Emergency Department
Admission: EM | Admit: 2023-03-03 | Discharge: 2023-03-03 | Disposition: A | Discharge status: Home / Self Care | Payer: Medicare Other | Attending: Emergency Medicine | Admitting: Emergency Medicine

## 2023-03-03 DIAGNOSIS — R42 Dizziness and giddiness: Secondary | ICD-10-CM | POA: Diagnosis present

## 2023-03-03 DIAGNOSIS — I10 Essential (primary) hypertension: Secondary | ICD-10-CM | POA: Diagnosis not present

## 2023-03-03 LAB — COMPREHENSIVE METABOLIC PANEL
ALT: 18 U/L (ref 0–44)
AST: 19 U/L (ref 15–41)
Albumin: 4 g/dL (ref 3.5–5.0)
Alkaline Phosphatase: 59 U/L (ref 38–126)
Anion gap: 9 (ref 5–15)
BUN: 22 mg/dL (ref 8–23)
CO2: 28 mmol/L (ref 22–32)
Calcium: 9.2 mg/dL (ref 8.9–10.3)
Chloride: 99 mmol/L (ref 98–111)
Creatinine, Ser: 0.86 mg/dL (ref 0.44–1.00)
GFR, Estimated: >60 mL/min (ref >60)
Glucose, Bld: 108 mg/dL — ABNORMAL HIGH (ref 70–99)
Potassium: 3.7 mmol/L (ref 3.5–5.1)
Sodium: 136 mmol/L (ref 135–145)
Total Bilirubin: 0.8 mg/dL (ref 0.0–1.2)
Total Protein: 6.3 g/dL — ABNORMAL LOW (ref 6.5–8.1)

## 2023-03-03 LAB — CBC WITH DIFFERENTIAL/PLATELET
Abs Immature Granulocytes: 0.02 10*3/uL (ref 0.00–0.07)
Basophils Absolute: 0 10*3/uL (ref 0.0–0.1)
Basophils Relative: 0 %
Eosinophils Absolute: 0.1 10*3/uL (ref 0.0–0.5)
Eosinophils Relative: 1 %
HCT: 36.2 % (ref 36.0–46.0)
Hemoglobin: 12 g/dL (ref 12.0–15.0)
Immature Granulocytes: 0 %
Lymphocytes Relative: 16 %
Lymphs Abs: 1.1 10*3/uL (ref 0.7–4.0)
MCH: 31.3 pg (ref 26.0–34.0)
MCHC: 33.1 g/dL (ref 30.0–36.0)
MCV: 94.5 fL (ref 80.0–100.0)
Monocytes Absolute: 0.5 10*3/uL (ref 0.1–1.0)
Monocytes Relative: 7 %
Neutro Abs: 5.2 10*3/uL (ref 1.7–7.7)
Neutrophils Relative %: 76 %
Platelets: 196 10*3/uL (ref 150–400)
RBC: 3.83 MIL/uL — ABNORMAL LOW (ref 3.87–5.11)
RDW: 14.2 % (ref 11.5–15.5)
WBC: 6.9 10*3/uL (ref 4.0–10.5)
nRBC: 0 % (ref 0.0–0.2)

## 2023-03-03 MED ORDER — MECLIZINE HCL 25 MG PO TABS
25.0000 mg | ORAL_TABLET | Freq: Once | ORAL | Status: AC
  Administered 2023-03-03: 25 mg via ORAL
  Filled 2023-03-03: qty 1

## 2023-03-03 MED ORDER — ONDANSETRON 4 MG PO TBDP
4.0000 mg | ORAL_TABLET | Freq: Once | ORAL | Status: AC
  Administered 2023-03-03: 4 mg via ORAL
  Filled 2023-03-03: qty 1

## 2023-03-03 MED ORDER — ONDANSETRON 4 MG PO TBDP: 4.0000 mg | ORAL_TABLET | Freq: Three times a day (TID) | ORAL | 0 refills | Status: AC | PRN

## 2023-03-03 MED ORDER — MECLIZINE HCL 25 MG PO TABS: 25.0000 mg | ORAL_TABLET | Freq: Two times a day (BID) | ORAL | 0 refills | Status: AC | PRN

## 2023-03-03 NOTE — ED Provider Notes (Signed)
 [facility] Provider Note    Event Date/Time   First MD Initiated Contact with Patient 03/03/23 Paulo Fruit     (approximate)   History   Hypertension and Dizziness   HPI  Ashlee Prince is a 89 y.o. female who presents with complaints of dizziness.  Patient reports that she is having vertigo symptoms which she has had in the past several times.  She denies neurodeficits.  No headache.  She went to walk-in clinic and they referred her here because her blood pressure is elevated.  She reports typically her blood pressure is recently well-controlled however when she is not feeling well at this more elevated.  Review of records demonstrates a long history of elevated blood pressures this is not unusual for her     Physical Exam   Triage Vital Signs: ED Triage Vitals [03/03/23 1803]  Encounter Vitals Group     BP (!) 181/118     Systolic BP Percentile      Diastolic BP Percentile      Pulse Rate 75     Resp 16     Temp 97.6 F (36.4 C)     Temp Source Oral     SpO2 99 %     Weight 54.4 kg (120 lb)     Height 1.588 m (5' 2.5")     Head Circumference      Peak Flow      Pain Score 0     Pain Loc      Pain Education      Exclude from Growth Chart     Most recent vital signs: Vitals:   03/03/23 1903 03/03/23 1922  BP: (!) 195/138 (!) 180/102  Pulse:    Resp:  15  Temp:    SpO2:       General: Awake, no distress.  CV:  Good peripheral perfusion.  Resp:  Normal effort.  Abd:  No distention.  Other:  Mild right-sided nystagmus, cranial nerves II 12 are normal, ambulating well without difficulty   ED Results / Procedures / Treatments   Labs (all labs ordered are listed, but only abnormal results are displayed) Labs Reviewed  CBC WITH DIFFERENTIAL/PLATELET - Abnormal; Notable for the following components:      Result Value   RBC 3.83 (*)    All other components within normal limits  COMPREHENSIVE METABOLIC PANEL - Abnormal; Notable for the  following components:   Glucose, Bld 108 (*)    Total Protein 6.3 (*)    All other components within normal limits     EKG     RADIOLOGY     PROCEDURES:  Critical Care performed:   Procedures   MEDICATIONS ORDERED IN ED: Medications  ondansetron (ZOFRAN-ODT) disintegrating tablet 4 mg (4 mg Oral Given 03/03/23 1901)  meclizine (ANTIVERT) tablet 25 mg (25 mg Oral Given 03/03/23 1901)     IMPRESSION / MDM / ASSESSMENT AND PLAN / ED COURSE  I reviewed the triage vital signs and the nursing notes. Patient's presentation is most consistent with exacerbation of chronic illness.  Patient presents with elevated blood pressure and vertigo.  She has a long history of blood pressure issues, reviewing prior ED visits her blood pressure is always been elevated.  She has no headache, no neurodeficits, reassuring neurologic exam.  She reports her vertigo symptoms are improved from earlier and they are consistent with episodes that she has had in the past, 1 time she had vertigo for nearly 4  months.  Will treat with meclizine, Zofran, she will take clonidine when she gets home which is what she typically does when her blood pressure is elevated.  Close follow-up with PCP recommended for recheck blood pressure        FINAL CLINICAL IMPRESSION(S) / ED DIAGNOSES   Final diagnoses:  Vertigo  Uncontrolled hypertension     Rx / DC Orders   ED Discharge Orders          Ordered    meclizine (ANTIVERT) 25 MG tablet  2 times daily PRN        03/03/23 1857    ondansetron (ZOFRAN-ODT) 4 MG disintegrating tablet  Every 8 hours PRN        03/03/23 1857             Note:  This document was prepared using Dragon voice recognition software and may include unintentional dictation errors.   Jene Every, MD 03/03/23 2008

## 2023-03-03 NOTE — ED Triage Notes (Signed)
 Patient reports waking up this AM with vertigo and the room was spinning. Went to [facility] for medicine and was sent to ER for hypertension.  Denies any falls. Denies vision changes. Denies pain.

## 2023-05-23 ENCOUNTER — Other Ambulatory Visit: Payer: Self-pay | Admitting: Infectious Diseases

## 2023-05-23 DIAGNOSIS — Z1231 Encounter for screening mammogram for malignant neoplasm of breast: Secondary | ICD-10-CM

## 2023-07-05 ENCOUNTER — Ambulatory Visit
Admission: RE | Admit: 2023-07-05 | Discharge: 2023-07-05 | Disposition: A | Discharge status: Home / Self Care | Source: Ambulatory Visit | Attending: Infectious Diseases | Admitting: Infectious Diseases

## 2023-07-05 DIAGNOSIS — Z1231 Encounter for screening mammogram for malignant neoplasm of breast: Secondary | ICD-10-CM | POA: Insufficient documentation

## 2023-11-07 ENCOUNTER — Ambulatory Visit: Admitting: Physical Therapy

## 2023-11-07 IMAGING — CT CT HEAD W/O CM
2 series · 15 of 30 positions shown, 17 images · non-contrast
Comparison: [id number]

CLINICAL DATA: Ataxia, essential hypertension



[Series 2: head without · axial · non-contrast · 0.44mm/px · z∈[-187,-67]mm · 7 of 33 slices shown, 9 images]
[im 5/33  brain]
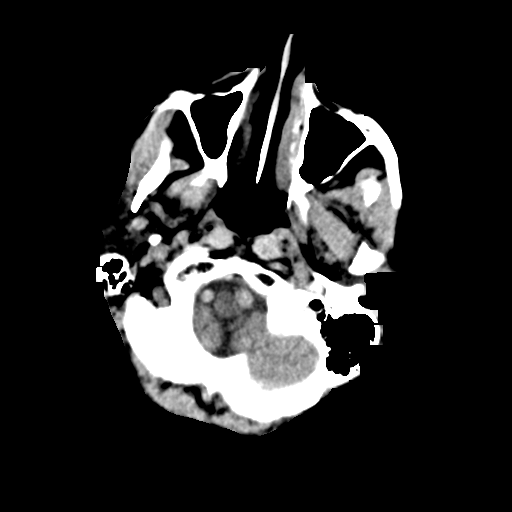
[im 5/33  bone]
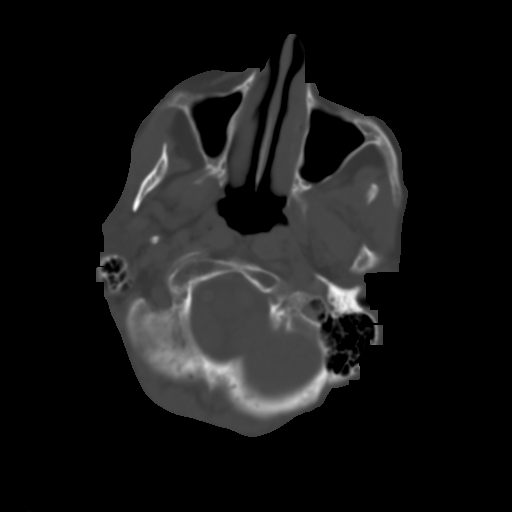
[im 9/33  brain]
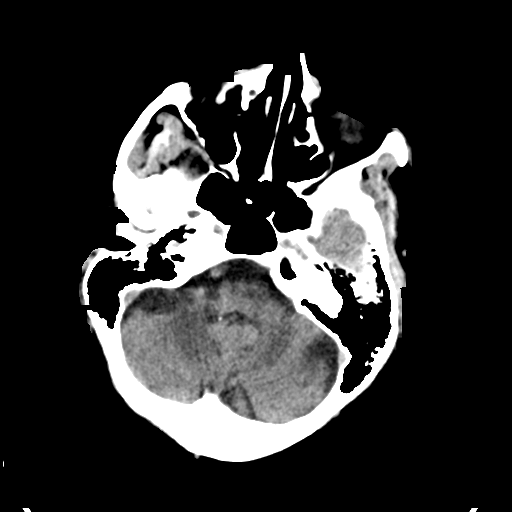
[im 13/33  brain]
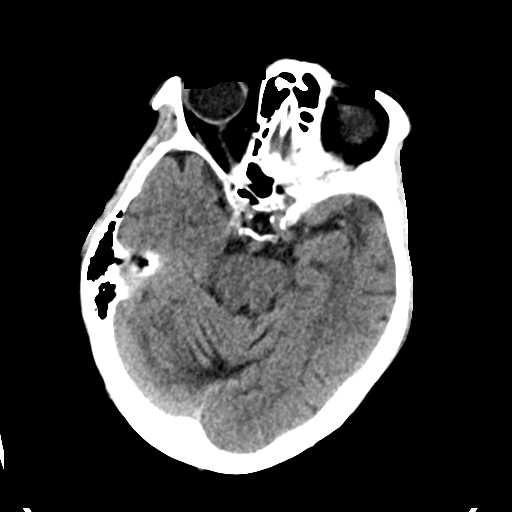
[im 17/33  brain]
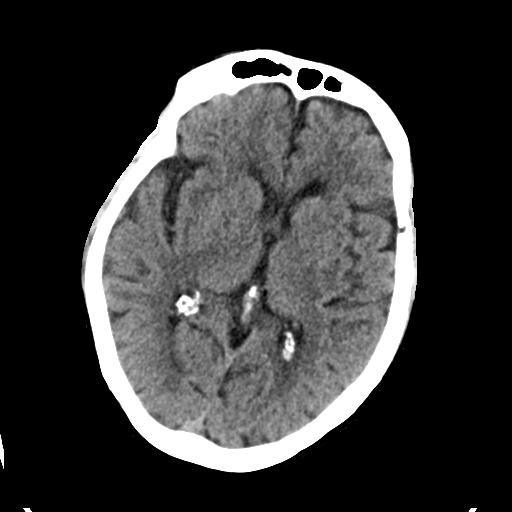
[im 21/33  brain]
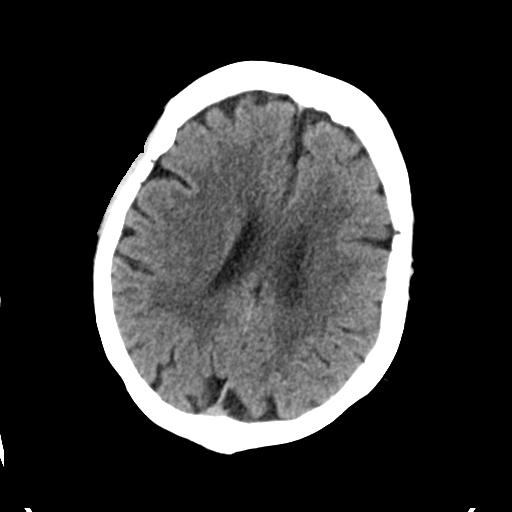
[im 21/33  bone]
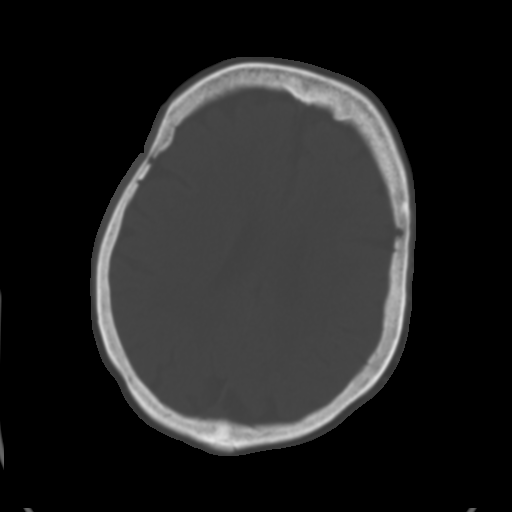
[im 25/33  brain]
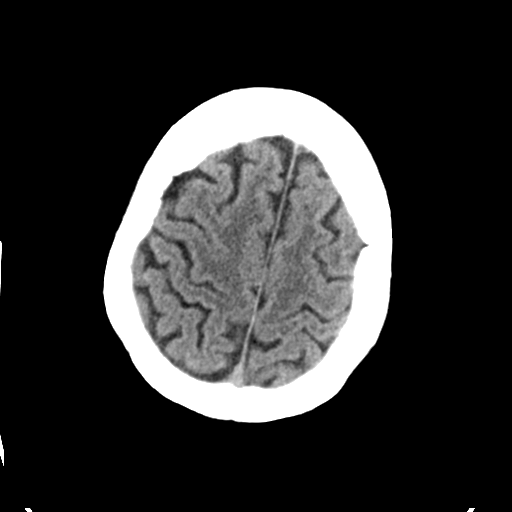
[im 29/33  brain]
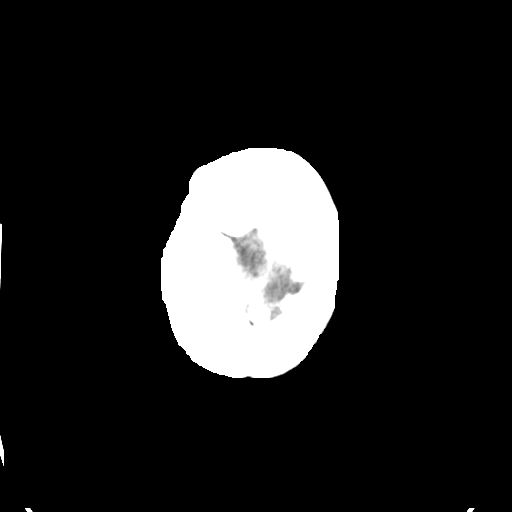

[Series 3: head bone · axial · 0.44mm/px · z∈[-191,-63]mm · 8 of 81 slices shown]
[im 9/81  bone]
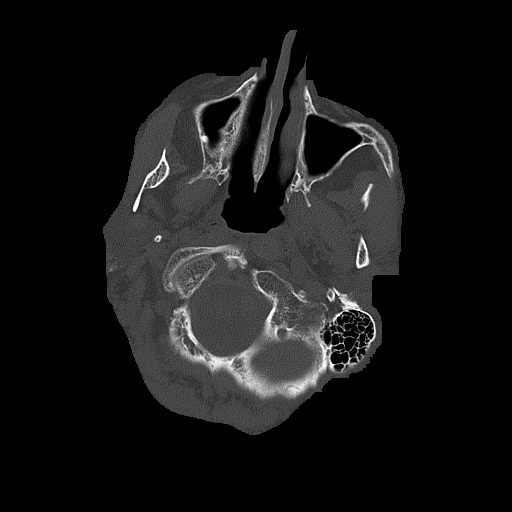
[im 17/81  bone]
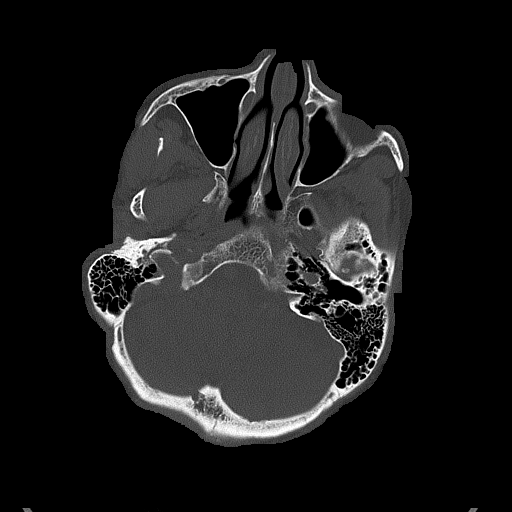
[im 25/81  bone]
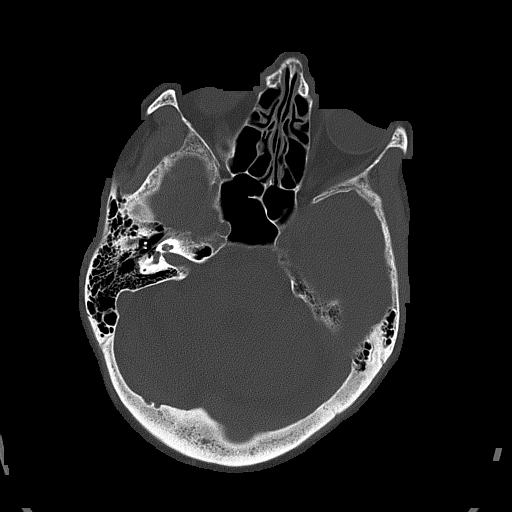
[im 37/81  bone]
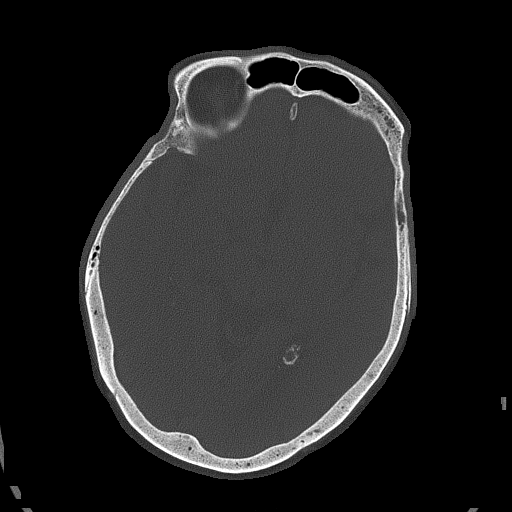
[im 45/81  bone]
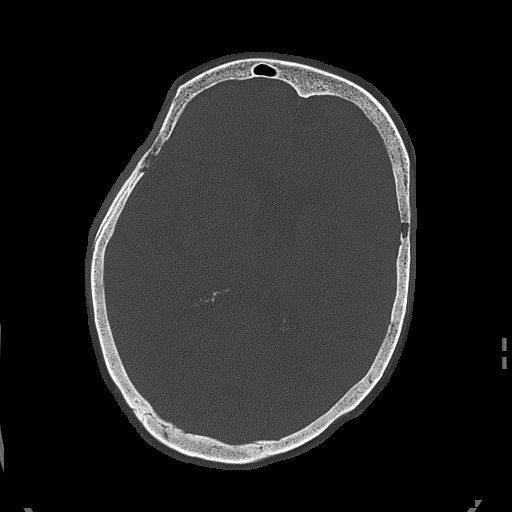
[im 57/81  bone]
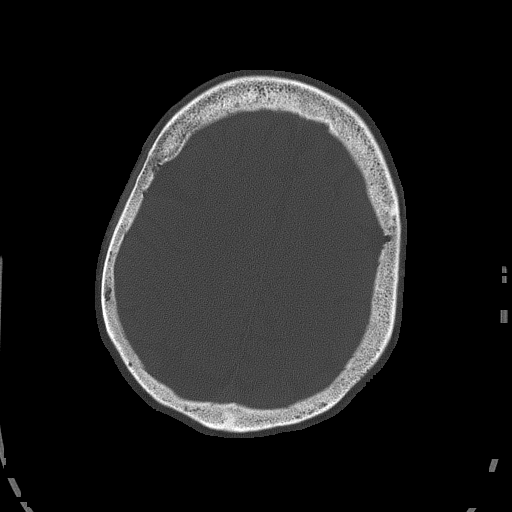
[im 65/81  bone]
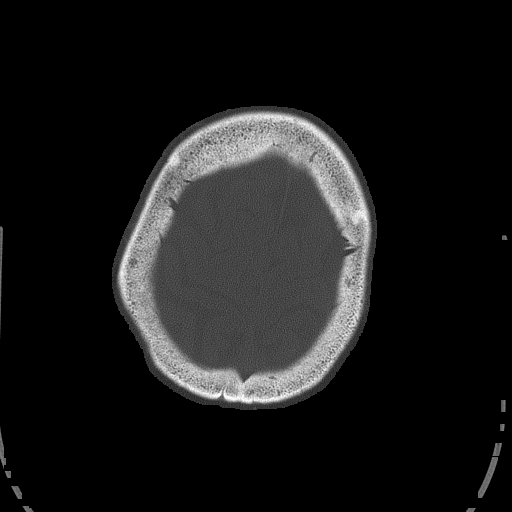
[im 73/81  bone]
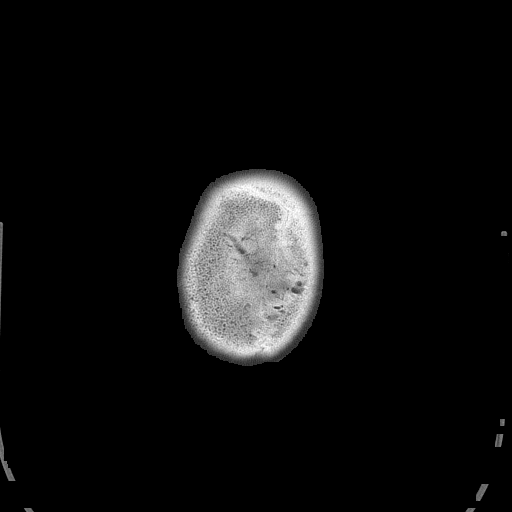

[15 of 30 positions shown; findings below may reference images not displayed]

FINDINGS: Brain: There is no acute intracranial hemorrhage, mass effect, or
edema. Gray-white differentiation is preserved. There is no
extra-axial fluid collection. Ventricles and sulci are within normal
limits in size and configuration. Patchy hypoattenuation in the
supratentorial white matter is nonspecific but may reflect similar
mild chronic microvascular ischemic changes.

Vascular: There is atherosclerotic calcification at the skull base.

Skull: Calvarium is unremarkable.

Sinuses/Orbits: No acute finding.

Other: None.
IMPRESSION: No acute intracranial abnormality. No significant change since the
prior study.

## 2023-11-14 ENCOUNTER — Encounter: Admitting: Physical Therapy

## 2023-11-21 ENCOUNTER — Encounter: Admitting: Physical Therapy

## 2023-11-28 ENCOUNTER — Encounter: Admitting: Physical Therapy

## 2023-12-05 ENCOUNTER — Encounter: Admitting: Physical Therapy

## 2023-12-12 ENCOUNTER — Encounter: Admitting: Physical Therapy

## 2023-12-19 ENCOUNTER — Encounter: Admitting: Physical Therapy

## 2023-12-26 ENCOUNTER — Encounter: Admitting: Physical Therapy

## 2024-01-09 ENCOUNTER — Encounter: Admitting: Physical Therapy

## 2024-01-16 ENCOUNTER — Encounter: Admitting: Physical Therapy
